# Patient Record
Sex: Female | Born: 1970 | Race: Asian | Hispanic: No | Marital: Married | Smoking: Never smoker
Health system: Southern US, Community
[De-identification: ages and names within clinical notes are randomized; demographics above are authoritative.]

## PROBLEM LIST (undated history)

## (undated) DIAGNOSIS — R21 Rash and other nonspecific skin eruption: Secondary | ICD-10-CM

## (undated) DIAGNOSIS — R232 Flushing: Secondary | ICD-10-CM

## (undated) HISTORY — DX: Rash and other nonspecific skin eruption: R21

## (undated) HISTORY — DX: Flushing: R23.2

---

## 2021-08-04 ENCOUNTER — Encounter: Payer: Self-pay | Admitting: Medical-Surgical

## 2021-08-04 ENCOUNTER — Ambulatory Visit (INDEPENDENT_AMBULATORY_CARE_PROVIDER_SITE_OTHER): Payer: Managed Care, Other (non HMO) | Admitting: Medical-Surgical

## 2021-08-04 ENCOUNTER — Other Ambulatory Visit: Payer: Self-pay

## 2021-08-04 VITALS — BP 115/77 | HR 71 | Resp 20 | Ht 60.43 in | Wt 153.5 lb

## 2021-08-04 DIAGNOSIS — Z1231 Encounter for screening mammogram for malignant neoplasm of breast: Secondary | ICD-10-CM

## 2021-08-04 DIAGNOSIS — Z7689 Persons encountering health services in other specified circumstances: Secondary | ICD-10-CM | POA: Diagnosis not present

## 2021-08-04 DIAGNOSIS — R232 Flushing: Secondary | ICD-10-CM

## 2021-08-04 DIAGNOSIS — Z1329 Encounter for screening for other suspected endocrine disorder: Secondary | ICD-10-CM | POA: Diagnosis not present

## 2021-08-04 DIAGNOSIS — Z0000 Encounter for general adult medical examination without abnormal findings: Secondary | ICD-10-CM

## 2021-08-04 DIAGNOSIS — L309 Dermatitis, unspecified: Secondary | ICD-10-CM

## 2021-08-04 NOTE — Progress Notes (Deleted)
ipi

## 2021-08-04 NOTE — Progress Notes (Signed)
New Patient Office Visit  Subjective:  Patient ID: Lindsey Donaldson, female    DOB: [birth year]  Age: 50 y.o. MRN: [id number]  CC:  Chief Complaint  Patient presents with   Establish Care    HPI Lindsey Donaldson presents to establish care.  Rash-has developed several small areas on her bilateral forearms that have small raised papules and are occasionally itchy.  She does use lotion that she bought in the [place] and wonders if this may be more of a contact dermatitis.  No other new cosmetics, chemicals, medications, foods, or environmental exposures.  Hot flashes-Notes that she does feel excessively hot at times although she does not have night sweats or heavy diaphoresis with her hot flashes.  She is continuing to have menstrual cycles and just feels that she may be premenopausal.  Past Medical History:  Diagnosis Date   Hot flashes    Rash     History reviewed. No pertinent surgical history.  Family History  Problem Relation Age of Onset   Cancer Maternal Grandfather     Social History   Socioeconomic History   Marital status: Married    Spouse name: Not on file   Number of children: Not on file   Years of education: Not on file   Highest education level: Not on file  Occupational History   Not on file  Tobacco Use   Smoking status: Never   Smokeless tobacco: Never  Vaping Use   Vaping Use: Never used  Substance and Sexual Activity   Alcohol use: Never   Drug use: Never   Sexual activity: Yes    Birth control/protection: Post-menopausal  Other Topics Concern   Not on file  Social History Narrative   Not on file   Social Determinants of Health   Financial Resource Strain: Not on file  Food Insecurity: Not on file  Transportation Needs: Not on file  Physical Activity: Not on file  Stress: Not on file  Social Connections: Not on file  Intimate Partner Violence: Not on file    ROS Review of Systems  Constitutional:  Negative for chills, diaphoresis,  fatigue, fever and unexpected weight change.  Respiratory:  Negative for cough, chest tightness, shortness of breath and wheezing.   Cardiovascular:  Negative for chest pain, palpitations and leg swelling.  Gastrointestinal:  Negative for abdominal pain, constipation, diarrhea, nausea and vomiting.  Skin:  Positive for rash.  Neurological:  Negative for dizziness, light-headedness and headaches.  Psychiatric/Behavioral:  Negative for dysphoric mood, self-injury, sleep disturbance and suicidal ideas. The patient is not nervous/anxious.    Objective:   Today's Vitals: BP 115/77 (BP Location: Left Arm, Patient Position: Sitting, Cuff Size: Normal)   Pulse 71   Resp 20   Ht 5' 0.43" (1.535 m)   Wt 153 lb 8 oz (69.6 kg)   SpO2 99%   BMI 29.55 kg/m   Physical Exam Vitals reviewed.  Constitutional:      General: She is not in acute distress.    Appearance: Normal appearance.  HENT:     Head: Normocephalic and atraumatic.     Right Ear: Tympanic membrane, ear canal and external ear normal. There is no impacted cerumen.     Left Ear: Tympanic membrane, ear canal and external ear normal. There is no impacted cerumen.  Eyes:     General: No scleral icterus.       Right eye: No discharge.        Left eye: No discharge.  Extraocular Movements: Extraocular movements intact.     Conjunctiva/sclera: Conjunctivae normal.     Pupils: Pupils are equal, round, and reactive to light.  Cardiovascular:     Rate and Rhythm: Normal rate and regular rhythm.     Pulses: Normal pulses.     Heart sounds: Normal heart sounds. No murmur heard.   No friction rub. No gallop.  Pulmonary:     Effort: Pulmonary effort is normal. No respiratory distress.     Breath sounds: Normal breath sounds. No wheezing.  Abdominal:     General: Bowel sounds are normal. There is no distension.     Palpations: Abdomen is soft. There is no mass.     Tenderness: There is no abdominal tenderness. There is no guarding or  rebound.     Hernia: No hernia is present.  Musculoskeletal:        General: Normal range of motion.     Cervical back: Normal range of motion and neck supple. No tenderness.  Lymphadenopathy:     Cervical: No cervical adenopathy.  Skin:    General: Skin is warm and dry.  Neurological:     Mental Status: She is alert and oriented to person, place, and time.  Psychiatric:        Mood and Affect: Mood normal.        Behavior: Behavior normal.        Thought Content: Thought content normal.        Judgment: Judgment normal.    Assessment & Plan:   1. Encounter to establish care Reviewed available information and discussed care concerns with patient.   2. Annual physical exam Checking CBC with differential, CMP, and lipid panel today. - Lipid panel - CBC with Differential/Platelet - COMPLETE METABOLIC PANEL WITH GFR  3. Thyroid disorder screen Checking TSH today. - TSH  4. Encounter for screening mammogram for malignant neoplasm of breast Mammogram ordered. - MM Digital Screening; Future  5.  Eczema Discussed using moisturizing lotions to prevent dry skin and associated irritation. Ok to use Cortisone (OTC) twice daily sparingly for up to 7 days for itching as needed.   6. Hot flashes Description of symptoms consistent with hot flashes. Likely in the perimenopausal area at this point. Symptoms not causing significant issues at this point so no intervention for now. If they become problematic, we can always discuss possible treatments.   No outpatient encounter medications on file as of 08/04/2021.   No facility-administered encounter medications on file as of 08/04/2021.    Follow-up: Return in about 1 year (around 08/04/2022) for annual physical exam or sooner if needed.   Thayer Ohm, DNP, APRN, FNP-BC [facility] and [facility]

## 2021-08-06 ENCOUNTER — Ambulatory Visit (INDEPENDENT_AMBULATORY_CARE_PROVIDER_SITE_OTHER): Payer: Managed Care, Other (non HMO)

## 2021-08-06 ENCOUNTER — Other Ambulatory Visit: Payer: Self-pay

## 2021-08-06 DIAGNOSIS — Z1231 Encounter for screening mammogram for malignant neoplasm of breast: Secondary | ICD-10-CM

## 2021-08-06 LAB — COMPLETE METABOLIC PANEL WITH GFR
AG Ratio: 1.6 (calc) (ref 1.0–2.5)
ALT: 12 U/L (ref 6–29)
AST: 18 U/L (ref 10–35)
Albumin: 4.7 g/dL (ref 3.6–5.1)
Alkaline phosphatase (APISO): 75 U/L (ref 31–125)
BUN: 12 mg/dL (ref 7–25)
CO2: 26 mmol/L (ref 20–32)
Calcium: 9.7 mg/dL (ref 8.6–10.2)
Chloride: 104 mmol/L (ref 98–110)
Creat: 0.71 mg/dL (ref 0.50–0.99)
Globulin: 3 g/dL (calc) (ref 1.9–3.7)
Glucose, Bld: 87 mg/dL (ref 65–99)
Potassium: 3.8 mmol/L (ref 3.5–5.3)
Sodium: 140 mmol/L (ref 135–146)
Total Bilirubin: 0.7 mg/dL (ref 0.2–1.2)
Total Protein: 7.7 g/dL (ref 6.1–8.1)
eGFR: 104 mL/min/{1.73_m2} (ref >=60)

## 2021-08-06 LAB — LIPID PANEL
Cholesterol: 207 mg/dL — ABNORMAL HIGH (ref <200)
HDL: 77 mg/dL (ref >=50)
LDL Cholesterol (Calc): 116 mg/dL (calc) — ABNORMAL HIGH
Non-HDL Cholesterol (Calc): 130 mg/dL (calc) — ABNORMAL HIGH (ref <130)
Total CHOL/HDL Ratio: 2.7 (calc) (ref <5.0)
Triglycerides: 47 mg/dL (ref <150)

## 2021-08-06 LAB — CBC WITH DIFFERENTIAL/PLATELET
Absolute Monocytes: 367 cells/uL (ref 200–950)
Basophils Absolute: 38 cells/uL (ref 0–200)
Basophils Relative: 0.8 %
Eosinophils Absolute: 141 cells/uL (ref 15–500)
Eosinophils Relative: 3 %
HCT: 41.6 % (ref 35.0–45.0)
Hemoglobin: 13.3 g/dL (ref 11.7–15.5)
Lymphs Abs: 2143 cells/uL (ref 850–3900)
MCH: 28.7 pg (ref 27.0–33.0)
MCHC: 32 g/dL (ref 32.0–36.0)
MCV: 89.8 fL (ref 80.0–100.0)
MPV: 9.8 fL (ref 7.5–12.5)
Monocytes Relative: 7.8 %
Neutro Abs: 2012 cells/uL (ref 1500–7800)
Neutrophils Relative %: 42.8 %
Platelets: 288 10*3/uL (ref 140–400)
RBC: 4.63 10*6/uL (ref 3.80–5.10)
RDW: 12.3 % (ref 11.0–15.0)
Total Lymphocyte: 45.6 %
WBC: 4.7 10*3/uL (ref 3.8–10.8)

## 2021-08-06 LAB — TSH: TSH: 2.93 mIU/L

## 2021-08-12 ENCOUNTER — Ambulatory Visit: Payer: Managed Care, Other (non HMO)

## 2021-11-30 ENCOUNTER — Encounter: Payer: Self-pay | Admitting: Family Medicine

## 2021-11-30 ENCOUNTER — Other Ambulatory Visit: Payer: Self-pay

## 2021-11-30 ENCOUNTER — Ambulatory Visit: Payer: Managed Care, Other (non HMO) | Admitting: Physician Assistant

## 2021-11-30 ENCOUNTER — Ambulatory Visit: Payer: Managed Care, Other (non HMO) | Admitting: Family Medicine

## 2021-11-30 DIAGNOSIS — Z23 Encounter for immunization: Secondary | ICD-10-CM | POA: Diagnosis not present

## 2021-11-30 DIAGNOSIS — Z8616 Personal history of COVID-19: Secondary | ICD-10-CM | POA: Diagnosis not present

## 2021-11-30 NOTE — Progress Notes (Signed)
Acute Office Visit  Subjective:    Patient ID: Lindsey Donaldson, female    DOB: [birth year], 50 y.o.   MRN: [id number]  Chief Complaint  Patient presents with   Letter for School/Work     HPI Patient is in today for COVID-19 recovery.  Patient reports symptoms began on 11/19/21 (sore throat, cough, fever). She had a positive home test. She wanted to make sure, so she went to [facility] on 11/22/21 and tested positive again. She reports that all symptoms are gone and she needs a note to return to work.     Past Medical History:  Diagnosis Date   Hot flashes    Rash     No past surgical history on file.  Family History  Problem Relation Age of Onset   Breast cancer Paternal Aunt    Cancer Maternal Grandfather     Social History   Socioeconomic History   Marital status: Married    Spouse name: Not on file   Number of children: Not on file   Years of education: Not on file   Highest education level: Not on file  Occupational History   Not on file  Tobacco Use   Smoking status: Never   Smokeless tobacco: Never  Vaping Use   Vaping Use: Never used  Substance and Sexual Activity   Alcohol use: Never   Drug use: Never   Sexual activity: Yes    Birth control/protection: Post-menopausal  Other Topics Concern   Not on file  Social History Narrative   Not on file   Social Determinants of Health   Financial Resource Strain: Not on file  Food Insecurity: Not on file  Transportation Needs: Not on file  Physical Activity: Not on file  Stress: Not on file  Social Connections: Not on file  Intimate Partner Violence: Not on file    No outpatient medications prior to visit.   No facility-administered medications prior to visit.    No Known Allergies  Review of Systems All review of systems negative except what is listed in the HPI     Objective:    Physical Exam Vitals reviewed.  Constitutional:      Appearance: Normal appearance. She is normal weight.  HENT:      Head: Normocephalic and atraumatic.     Nose: Nose normal.  Eyes:     Conjunctiva/sclera: Conjunctivae normal.  Cardiovascular:     Rate and Rhythm: Normal rate and regular rhythm.  Pulmonary:     Effort: Pulmonary effort is normal.  Musculoskeletal:     Cervical back: Normal range of motion and neck supple.  Skin:    General: Skin is warm and dry.  Neurological:     General: No focal deficit present.     Mental Status: She is alert and oriented to person, place, and time. Mental status is at baseline.  Psychiatric:        Mood and Affect: Mood normal.        Behavior: Behavior normal.        Thought Content: Thought content normal.        Judgment: Judgment normal.    There were no vitals taken for this visit. Wt Readings from Last 3 Encounters:  08/04/21 153 lb 8 oz (69.6 kg)    Health Maintenance Due  Topic Date Due   HIV Screening  Never done   Hepatitis C Screening  Never done   TETANUS/TDAP  Never done   PAP SMEAR-Modifier  Never done   COLONOSCOPY (Pts 45-65yrs Insurance coverage will need to be confirmed)  Never done   COVID-19 Vaccine (3 - Booster for Moderna series) 04/13/2021    There are no preventive care reminders to display for this patient.   Lab Results  Component Value Date   TSH 2.93 08/06/2021   Lab Results  Component Value Date   WBC 4.7 08/06/2021   HGB 13.3 08/06/2021   HCT 41.6 08/06/2021   MCV 89.8 08/06/2021   PLT 288 08/06/2021   Lab Results  Component Value Date   NA 140 08/06/2021   K 3.8 08/06/2021   CO2 26 08/06/2021   GLUCOSE 87 08/06/2021   BUN 12 08/06/2021   CREATININE 0.71 08/06/2021   BILITOT 0.7 08/06/2021   AST 18 08/06/2021   ALT 12 08/06/2021   PROT 7.7 08/06/2021   CALCIUM 9.7 08/06/2021   EGFR 104 08/06/2021   Lab Results  Component Value Date   CHOL 207 (H) 08/06/2021   Lab Results  Component Value Date   HDL 77 08/06/2021   Lab Results  Component Value Date   LDLCALC 116 (H) 08/06/2021   Lab  Results  Component Value Date   TRIG 47 08/06/2021   Lab Results  Component Value Date   CHOLHDL 2.7 08/06/2021   No results found for: HGBA1C     Assessment & Plan:   1. Need for influenza vaccination - Flu Vaccine QUAD 6+ mos PF IM (Fluarix Quad PF)  2. History of COVID-19 She is out of quarantine window and is completely symptom free now. Educated that we do not typically test again. Note provided to return to work.     Follow-up as needed.  Purcell Nails Olevia Bowens, DNP, FNP-C

## 2022-12-28 IMAGING — MG MM DIGITAL SCREENING BILAT W/ TOMO AND CAD
8 series · 9 of 24 positions shown · non-contrast
Comparison: None.

CLINICAL DATA: Screening.

EXAM:
DIGITAL SCREENING BILATERAL MAMMOGRAM WITH TOMOSYNTHESIS AND CAD
TECHNIQUE: Bilateral screening digital craniocaudal and mediolateral oblique
mammograms were obtained. Bilateral screening digital breast
tomosynthesis was performed. The images were evaluated with
computer-aided detection.

[R CC synth-2D]
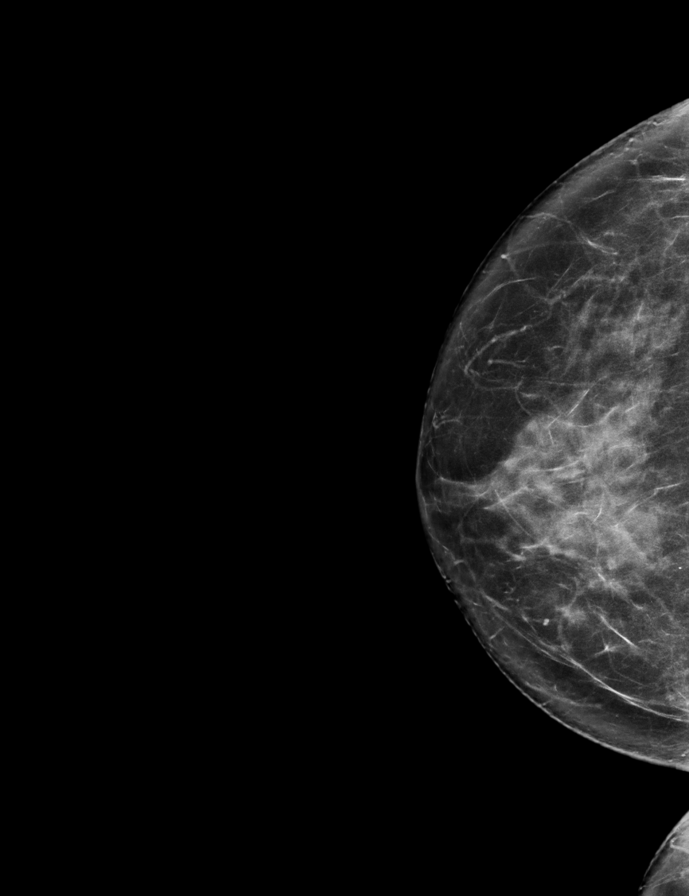

[L MLO synth-2D]
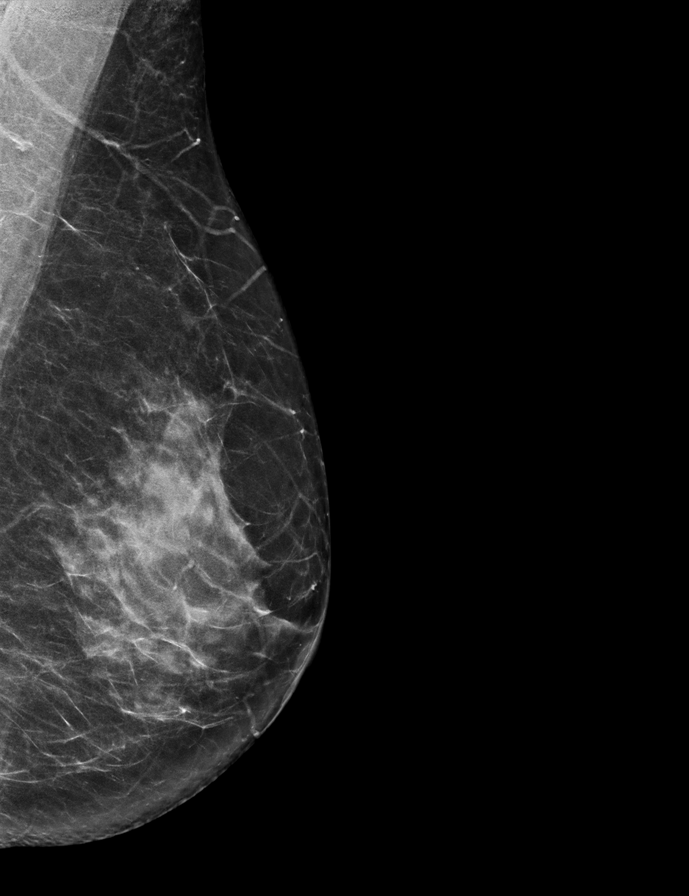

[R MLO synth-2D]
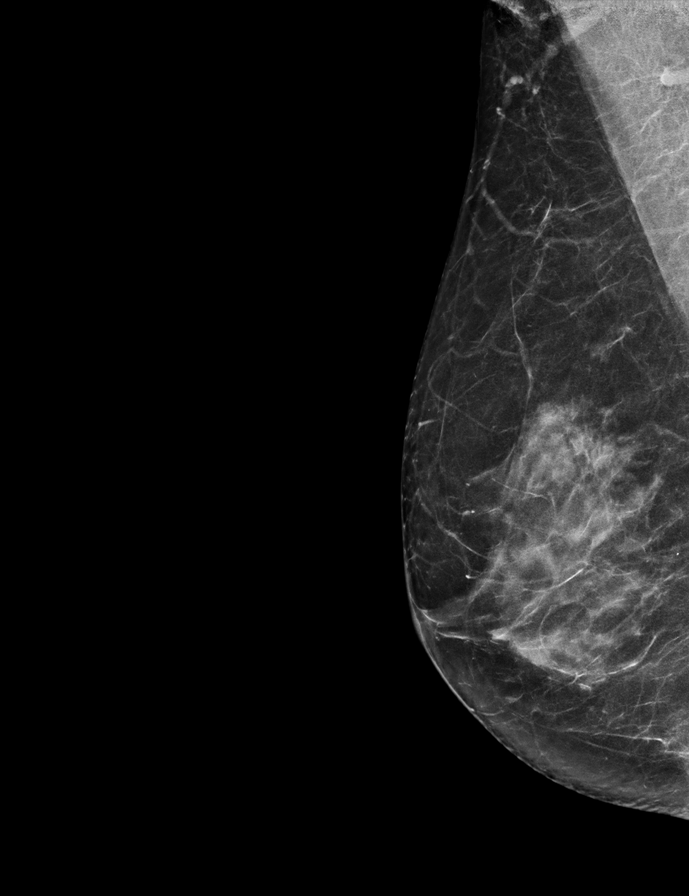

[L CC synth-2D]
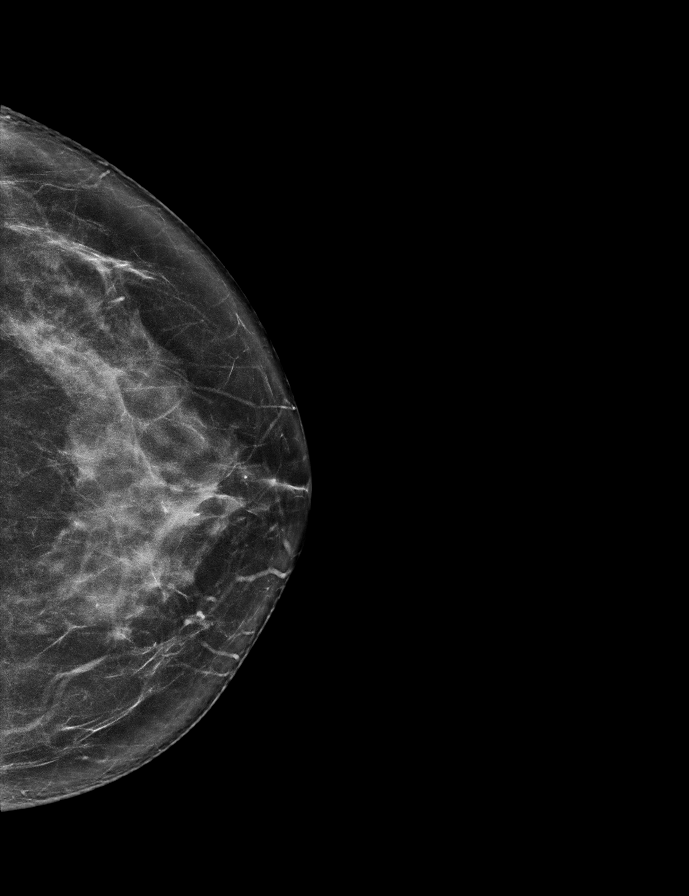

[R CC tomo · 2 of 71 frames shown]
[frame 23/71]
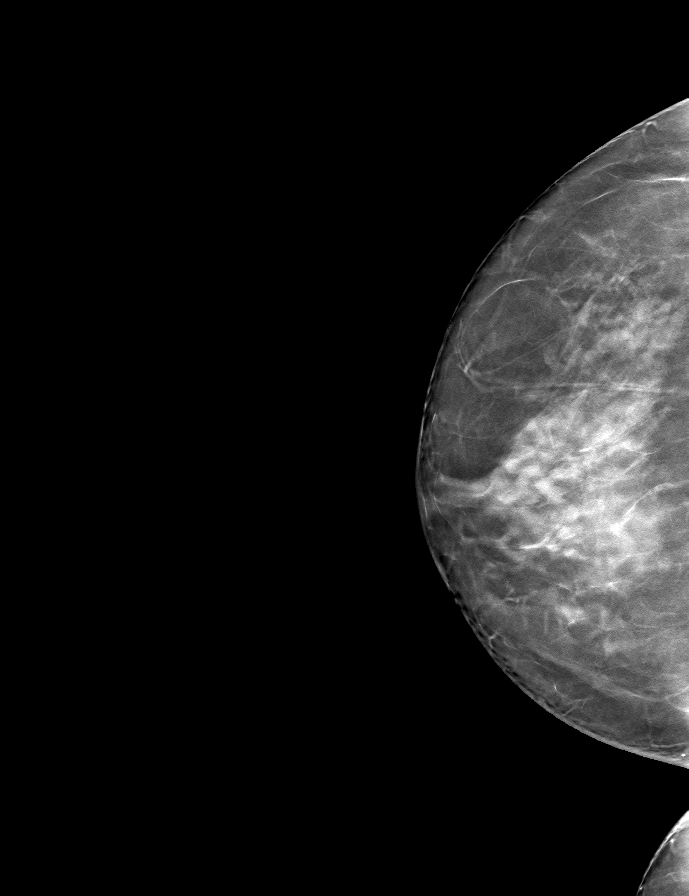
[frame 36/71]
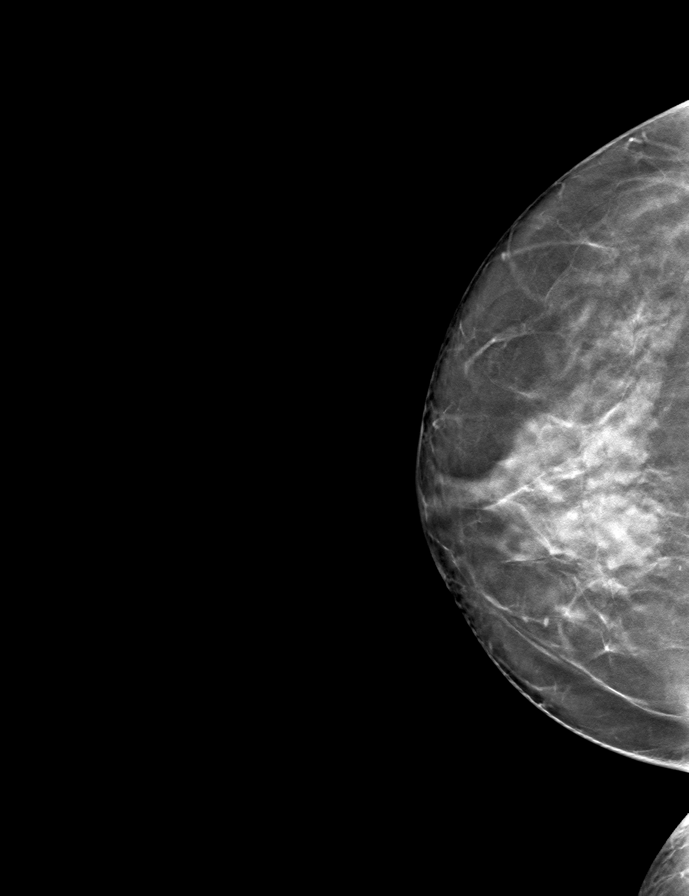

[L MLO tomo · tomo slice 35/68.0]
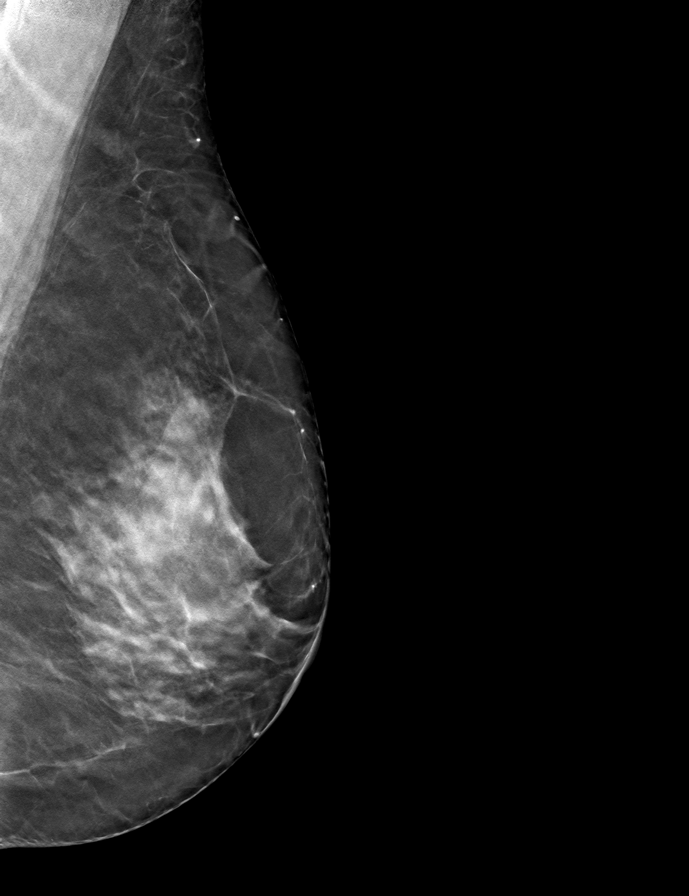

[L CC tomo · tomo slice 35/70.0]
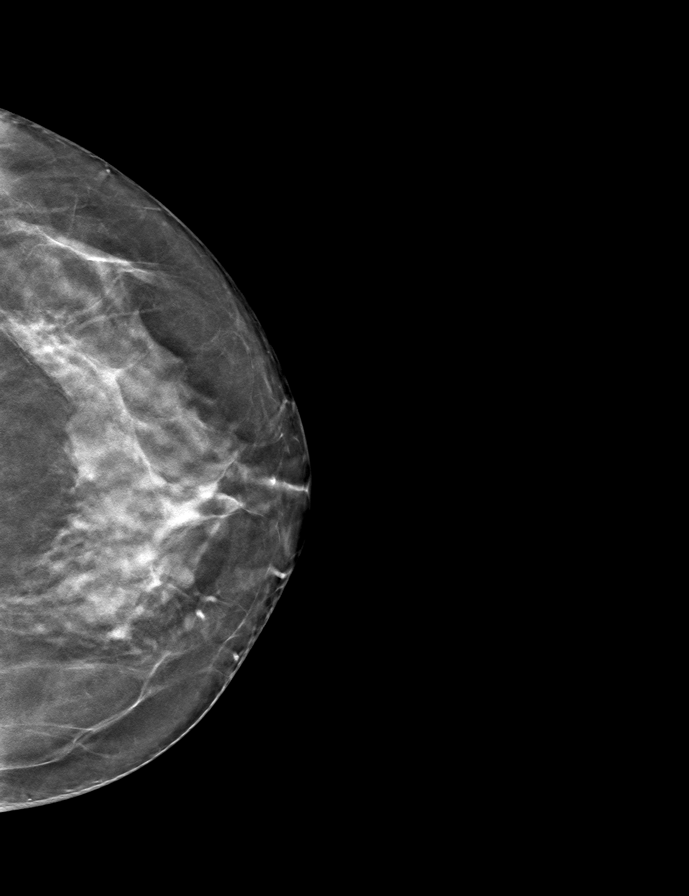

[R MLO tomo · tomo slice 33/65.0]
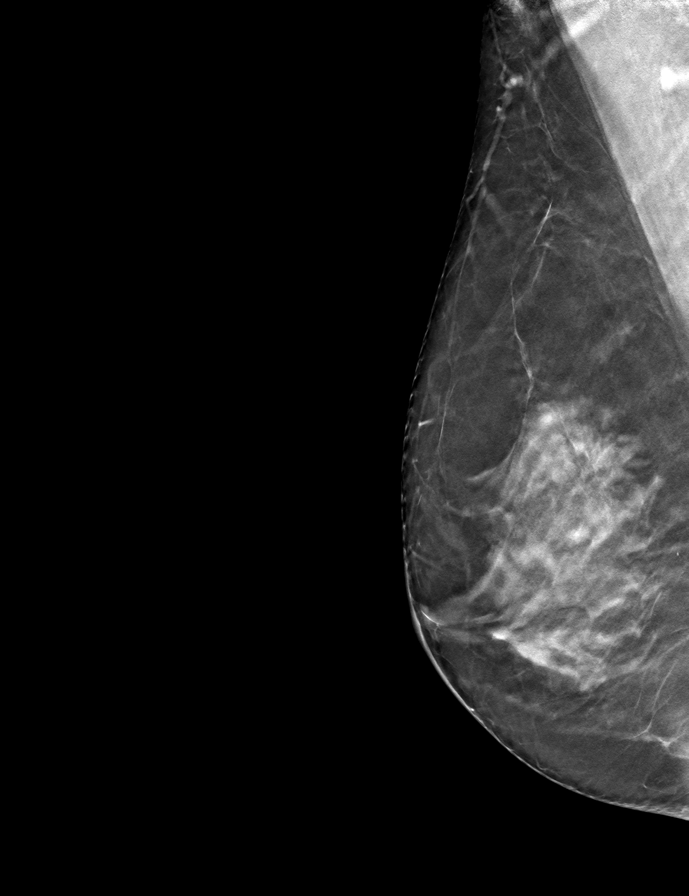

[9 of 24 positions shown; findings below may reference images not displayed]

ACR Breast Density Category c: The breast tissue is heterogeneously
dense, which may obscure small masses
FINDINGS: There are no findings suspicious for malignancy.
IMPRESSION: No mammographic evidence of malignancy. A result letter of this
screening mammogram will be mailed directly to the patient.

RECOMMENDATION:
Screening mammogram in one year. (Code:C8-T-HNK)

BI-RADS CATEGORY  1: Negative.

## 2023-07-29 ENCOUNTER — Ambulatory Visit: Payer: Managed Care, Other (non HMO) | Admitting: Medical-Surgical

## 2023-07-29 ENCOUNTER — Encounter: Payer: Self-pay | Admitting: Medical-Surgical

## 2023-07-29 VITALS — BP 115/83 | HR 75 | Ht 60.43 in | Wt 119.0 lb

## 2023-07-29 DIAGNOSIS — R238 Other skin changes: Secondary | ICD-10-CM

## 2023-07-29 MED ORDER — VALACYCLOVIR HCL 1 G PO TABS: 1000.0000 mg | ORAL_TABLET | Freq: Three times a day (TID) | ORAL | 0 refills | Status: AC

## 2023-07-29 MED ORDER — PREDNISONE 10 MG (48) PO TBPK: ORAL_TABLET | Freq: Every day | ORAL | 0 refills | Status: AC

## 2023-07-29 NOTE — Progress Notes (Signed)
        Established patient visit  History, exam, impression, and plan:  1. Vesicular rash Very pleasant 52 year old female presenting today for evaluation of a rash that developed over the weekend and has continued to worsen.  She and her dog went hiking on Friday and she noticed she had developed some lesions that evening and the next morning.  She now has intact vesicles, some on an erythematous base scattered over her arms, back, torso, buttocks, legs, and feet.  Does not remember any environmental exposure.  No new medications, cosmetics, cleansers, material, etc.  The rash is very itchy and she has been using Benadryl and cortisone 10 without relief of symptoms.  Endorses a childhood history of chickenpox but has never had shingles.  On exam, vesicles ranging in size from pinpoint to 0.5 cm.  Unclear etiology.  Consider possible environmental exposure to poison oak/ivy/sumac while hiking.  Given the vesicular nature of the rash, also consider disseminated shingles.  Discussed treatment options.  We will go ahead with a 12-day prednisone taper as well as Valtrex 3 times daily x 7 days to cover for shingles.  Okay to continue Benadryl 25-50 mg every 6 hours as needed for itching.  Discussed using other over-the-counter topicals such as calamine.  May benefit from tepid baths/showers and can consider using oatmeal or cornstarch products for soothing skin.  Avoid popping blisters as this opens up the chance for infection.  Procedures performed this visit: None.  Return if symptoms worsen or fail to improve.  __________________________________ Thayer Ohm, DNP, APRN, FNP-BC Primary [facility] and Sports Medicine [facility] [place]

## 2024-07-31 ENCOUNTER — Telehealth: Payer: Self-pay

## 2024-07-31 NOTE — Telephone Encounter (Signed)
 I tried to call patient to advise of the need for a mammogram. No answer, no voicemail.
